# Patient Record
Sex: Female | Born: 1955
Health system: Southern US, Community
[De-identification: ages and names within clinical notes are randomized; demographics above are authoritative.]

## PROBLEM LIST (undated history)

## (undated) DIAGNOSIS — I10 Essential (primary) hypertension: Secondary | ICD-10-CM

## (undated) HISTORY — PX: CHOLECYSTECTOMY: SHX55

---

## 2017-09-07 ENCOUNTER — Other Ambulatory Visit: Payer: Self-pay

## 2017-09-07 ENCOUNTER — Encounter (HOSPITAL_BASED_OUTPATIENT_CLINIC_OR_DEPARTMENT_OTHER): Payer: Self-pay

## 2017-09-07 ENCOUNTER — Emergency Department (HOSPITAL_BASED_OUTPATIENT_CLINIC_OR_DEPARTMENT_OTHER)
Admission: EM | Admit: 2017-09-07 | Discharge: 2017-09-07 | Disposition: A | Discharge status: Home / Self Care | Payer: BLUE CROSS/BLUE SHIELD | Attending: Emergency Medicine | Admitting: Emergency Medicine

## 2017-09-07 DIAGNOSIS — Z79899 Other long term (current) drug therapy: Secondary | ICD-10-CM | POA: Insufficient documentation

## 2017-09-07 DIAGNOSIS — K2900 Acute gastritis without bleeding: Secondary | ICD-10-CM

## 2017-09-07 DIAGNOSIS — I10 Essential (primary) hypertension: Secondary | ICD-10-CM | POA: Insufficient documentation

## 2017-09-07 DIAGNOSIS — F17210 Nicotine dependence, cigarettes, uncomplicated: Secondary | ICD-10-CM | POA: Insufficient documentation

## 2017-09-07 DIAGNOSIS — R1013 Epigastric pain: Secondary | ICD-10-CM | POA: Diagnosis present

## 2017-09-07 HISTORY — DX: Essential (primary) hypertension: I10

## 2017-09-07 LAB — URINALYSIS, ROUTINE W REFLEX MICROSCOPIC
Bilirubin Urine: NEGATIVE
GLUCOSE, UA: NEGATIVE mg/dL
Hgb urine dipstick: NEGATIVE
Ketones, ur: NEGATIVE mg/dL
LEUKOCYTES UA: NEGATIVE
Nitrite: NEGATIVE
PROTEIN: NEGATIVE mg/dL
Specific Gravity, Urine: >1.03 — ABNORMAL HIGH (ref 1.005–1.030)
pH: 6 (ref 5.0–8.0)

## 2017-09-07 LAB — COMPREHENSIVE METABOLIC PANEL
ALT: 18 U/L (ref 0–44)
ANION GAP: 9 (ref 5–15)
AST: 25 U/L (ref 15–41)
Albumin: 4 g/dL (ref 3.5–5.0)
Alkaline Phosphatase: 59 U/L (ref 38–126)
BUN: 19 mg/dL (ref 8–23)
CHLORIDE: 106 mmol/L (ref 98–111)
CO2: 25 mmol/L (ref 22–32)
CREATININE: 0.71 mg/dL (ref 0.44–1.00)
Calcium: 9.7 mg/dL (ref 8.9–10.3)
Glucose, Bld: 113 mg/dL — ABNORMAL HIGH (ref 70–99)
Potassium: 3.6 mmol/L (ref 3.5–5.1)
Sodium: 140 mmol/L (ref 135–145)
Total Bilirubin: 0.6 mg/dL (ref 0.3–1.2)
Total Protein: 7.7 g/dL (ref 6.5–8.1)

## 2017-09-07 LAB — LIPASE, BLOOD: LIPASE: 37 U/L (ref 11–51)

## 2017-09-07 LAB — CBC
HCT: 40 % (ref 36.0–46.0)
Hemoglobin: 13.2 g/dL (ref 12.0–15.0)
MCH: 31.4 pg (ref 26.0–34.0)
MCHC: 33 g/dL (ref 30.0–36.0)
MCV: 95.2 fL (ref 78.0–100.0)
PLATELETS: 320 10*3/uL (ref 150–400)
RBC: 4.2 MIL/uL (ref 3.87–5.11)
RDW: 11.6 % (ref 11.5–15.5)
WBC: 14.8 10*3/uL — AB (ref 4.0–10.5)

## 2017-09-07 MED ORDER — ONDANSETRON HCL 4 MG/2ML IJ SOLN
INTRAMUSCULAR | Status: AC
  Filled 2017-09-07: qty 2

## 2017-09-07 MED ORDER — PANTOPRAZOLE SODIUM 20 MG PO TBEC: 20.0000 mg | DELAYED_RELEASE_TABLET | Freq: Every day | ORAL | 0 refills | Status: AC

## 2017-09-07 MED ORDER — SUCRALFATE 1 G PO TABS: 1.0000 g | ORAL_TABLET | Freq: Three times a day (TID) | ORAL | 0 refills | Status: AC

## 2017-09-07 MED ORDER — KETOROLAC TROMETHAMINE 30 MG/ML IJ SOLN
30.0000 mg | Freq: Once | INTRAMUSCULAR | Status: AC
  Administered 2017-09-07: 30 mg via INTRAVENOUS
  Filled 2017-09-07: qty 1

## 2017-09-07 MED ORDER — ONDANSETRON HCL 4 MG/2ML IJ SOLN
4.0000 mg | Freq: Once | INTRAMUSCULAR | Status: AC | PRN
  Administered 2017-09-07: 4 mg via INTRAVENOUS

## 2017-09-07 MED ORDER — GI COCKTAIL ~~LOC~~
30.0000 mL | Freq: Once | ORAL | Status: AC
  Administered 2017-09-07: 30 mL via ORAL
  Filled 2017-09-07: qty 30

## 2017-09-07 MED ORDER — SODIUM CHLORIDE 0.9 % IV BOLUS
500.0000 mL | Freq: Once | INTRAVENOUS | Status: AC
  Administered 2017-09-07: 500 mL via INTRAVENOUS

## 2017-09-07 MED ORDER — SODIUM CHLORIDE 0.9 % IV BOLUS: 500.0000 mL | Freq: Once | INTRAVENOUS | Status: DC

## 2017-09-07 NOTE — ED Provider Notes (Signed)
[facility] EMERGENCY DEPARTMENT Provider Note   CSN: [id number] Arrival date & time: 09/07/17  2022     History   Chief Complaint Chief Complaint  Patient presents with  . Headache  . Abdominal Pain    HPI Vicki Vance is a 62 y.o. female.  Patient is a 62 year old female who presents with abdominal pain.  She states over the last 2 days she has had some pressure in her sinuses with pain in her face around her sinuses.  She went to urgent care and got a shot of Decadron and is been using nasal spray.  She has a mild ongoing headache which she describes as pain over her sinuses.  She points to her maxillary sinuses.  She denies any fevers.  She had a little bit of nasal drainage.  No neck pain.  Today she started having some pain in her upper abdomen.  She had some nausea but no vomiting.  No diarrhea.  She is status post cholecystectomy in the past.  She has no change in bowel habits.  She denies any known history of reflux.     Past Medical History:  Diagnosis Date  . Hypertension     There are no active problems to display for this patient.   Past Surgical History:  Procedure Laterality Date  . CHOLECYSTECTOMY       OB History   None      Home Medications    Prior to Admission medications   Medication Sig Start Date End Date Taking? Authorizing Provider  pantoprazole (PROTONIX) 20 MG tablet Take 1 tablet (20 mg total) by mouth daily. 09/07/17   Rolan Bucco, MD  sucralfate (CARAFATE) 1 g tablet Take 1 tablet (1 g total) by mouth 4 (four) times daily -  with meals and at bedtime. 09/07/17   Rolan Bucco, MD    Family History No family history on file.  Social History Social History   Tobacco Use  . Smoking status: Current Every Day Smoker  . Smokeless tobacco: Never Used  Substance Use Topics  . Alcohol use: Yes    Comment: occ  . Drug use: Never     Allergies   Patient has no known allergies.   Review of Systems Review of Systems    Constitutional: Negative for chills, diaphoresis, fatigue and fever.  HENT: Negative for congestion, rhinorrhea and sneezing.   Eyes: Negative.   Respiratory: Negative for cough, chest tightness and shortness of breath.   Cardiovascular: Negative for chest pain and leg swelling.  Gastrointestinal: Positive for abdominal pain and nausea. Negative for blood in stool, diarrhea and vomiting.  Genitourinary: Negative for difficulty urinating, flank pain, frequency and hematuria.  Musculoskeletal: Negative for arthralgias and back pain.  Skin: Negative for rash.  Neurological: Positive for headaches. Negative for dizziness, speech difficulty, weakness and numbness.     Physical Exam Updated Vital Signs BP (!) 159/95 (BP Location: Left Arm)   Pulse 98   Temp 98.4 F (36.9 C) (Oral)   Resp 20   Ht 5\' 1"  (1.549 m)   Wt 74.8 kg   SpO2 96%   BMI 31.18 kg/m   Physical Exam  Constitutional: She is oriented to person, place, and time. She appears well-developed and well-nourished.  HENT:  Head: Normocephalic and atraumatic.  No specific pain over her sinuses.  No meningismus.  No photophobia  Eyes: Pupils are equal, round, and reactive to light.  Neck: Normal range of motion. Neck supple.  Cardiovascular:  Normal rate, regular rhythm and normal heart sounds.  Pulmonary/Chest: Effort normal and breath sounds normal. No respiratory distress. She has no wheezes. She has no rales. She exhibits no tenderness.  Abdominal: Soft. Bowel sounds are normal. There is tenderness in the epigastric area. There is no rebound and no guarding.  Musculoskeletal: Normal range of motion. She exhibits no edema.  Lymphadenopathy:    She has no cervical adenopathy.  Neurological: She is alert and oriented to person, place, and time.  Skin: Skin is warm and dry. No rash noted.  Psychiatric: She has a normal mood and affect.     ED Treatments / Results  Labs (all labs ordered are listed, but only abnormal  results are displayed) Labs Reviewed  COMPREHENSIVE METABOLIC PANEL - Abnormal; Notable for the following components:      Result Value   Glucose, Bld 113 (*)    All other components within normal limits  CBC - Abnormal; Notable for the following components:   WBC 14.8 (*)    All other components within normal limits  URINALYSIS, ROUTINE W REFLEX MICROSCOPIC - Abnormal; Notable for the following components:   Specific Gravity, Urine >1.030 (*)    All other components within normal limits  LIPASE, BLOOD    EKG None  Radiology No results found.  Procedures Procedures (including critical care time)  Medications Ordered in ED Medications  ondansetron (ZOFRAN) injection 4 mg (4 mg Intravenous Given 09/07/17 2110)  ketorolac (TORADOL) 30 MG/ML injection 30 mg (30 mg Intravenous Given 09/07/17 2150)  sodium chloride 0.9 % bolus 500 mL (0 mLs Intravenous Stopped 09/07/17 2222)  gi cocktail (Maalox,Lidocaine,Donnatal) (30 mLs Oral Given 09/07/17 2154)     Initial Impression / Assessment and Plan / ED Course  I have reviewed the triage vital signs and the nursing notes.  Pertinent labs & imaging results that were available during my care of the patient were reviewed by me and considered in my medical decision making (see chart for details).     Patient is a 62 year old female who presents with abdominal pain.  She has some mild tenderness in her epigastrium.  She was given Pepcid as well as a GI cocktail.  She feels much better after this.  She has no vomiting.  No ongoing abdominal pain.  Her labs are non-concerning.  There is no suggestions of pancreatitis.  Her LFTs are normal.  Her white count is mildly elevated but she has no other suggestions of infection.  Her headache seems to be sinus related and is fairly mild at this point.  She does not have any other associated symptoms.  I encouraged her to continue using the Flonase.  She has a gastroenterologist in [facility]  I gave her a  prescription for Protonix and Carafate.  I encouraged her to follow-up with the gastroenterologist if her symptoms are not improving or return here as needed for any worsening symptoms.  Final Clinical Impressions(s) / ED Diagnoses   Final diagnoses:  Acute gastritis without hemorrhage, unspecified gastritis type    ED Discharge Orders         Ordered    pantoprazole (PROTONIX) 20 MG tablet  Daily     09/07/17 2229    sucralfate (CARAFATE) 1 g tablet  3 times daily with meals & bedtime     09/07/17 2229           Rolan Bucco, MD 09/07/17 2234

## 2017-09-07 NOTE — ED Triage Notes (Signed)
C/o HA x 2 days-was seen at [facility] yesterday-given nasal spray and steroid-c/o n/v, abd pain today-NAD-steady gait

## 2017-09-07 NOTE — ED Notes (Signed)
Pt c/o epigastric pain and headache with one episode of vomiting this evening. Pt has not tried anything for her symptoms

## 2019-02-17 ENCOUNTER — Ambulatory Visit: Payer: BLUE CROSS/BLUE SHIELD | Attending: Internal Medicine

## 2019-02-17 DIAGNOSIS — Z23 Encounter for immunization: Secondary | ICD-10-CM | POA: Insufficient documentation

## 2019-02-17 NOTE — Progress Notes (Signed)
   Covid-19 Vaccination Clinic  Name:  Vicki Vance    MRN: [id number] DOB: [birth year]  02/17/2019  [name] was observed post Covid-19 immunization for 15 minutes without incidence. She was provided with Vaccine Information Sheet and instruction to access the V-Safe system.   [name] was instructed to call 911 with any severe reactions post vaccine: Marland Kitchen Difficulty breathing  . Swelling of your face and throat  . A fast heartbeat  . A bad rash all over your body  . Dizziness and weakness    Immunizations Administered    Name Date Dose VIS Date Route   Pfizer COVID-19 Vaccine 02/17/2019 11:08 AM 0.3 mL 12/15/2018 Intramuscular   Manufacturer: [employer], [employer]   Lot: [id number]   NDC: [address]-1

## 2019-03-12 ENCOUNTER — Ambulatory Visit: Payer: BLUE CROSS/BLUE SHIELD | Attending: Internal Medicine

## 2019-03-12 DIAGNOSIS — Z23 Encounter for immunization: Secondary | ICD-10-CM | POA: Insufficient documentation

## 2019-03-12 NOTE — Progress Notes (Signed)
   Covid-19 Vaccination Clinic  Name:  Vicki Vance    MRN: [id number] DOB: 1955/07/14  03/12/2019  Vicki Vance was observed post Covid-19 immunization for 15 minutes without incident. She was provided with Vaccine Information Sheet and instruction to access the V-Safe system.   Vicki Vance was instructed to call 911 with any severe reactions post vaccine: Marland Kitchen Difficulty breathing  . Swelling of face and throat  . A fast heartbeat  . A bad rash all over body  . Dizziness and weakness   Immunizations Administered    Name Date Dose VIS Date Route   Pfizer COVID-19 Vaccine 03/12/2019 11:13 AM 0.3 mL 12/15/2018 Intramuscular   Manufacturer: [employer], [employer]   Lot: [id number]   NDC: [address]-6

## 2020-03-17 ENCOUNTER — Encounter (HOSPITAL_BASED_OUTPATIENT_CLINIC_OR_DEPARTMENT_OTHER): Payer: Self-pay

## 2020-03-17 ENCOUNTER — Emergency Department (HOSPITAL_BASED_OUTPATIENT_CLINIC_OR_DEPARTMENT_OTHER): Payer: BLUE CROSS/BLUE SHIELD

## 2020-03-17 ENCOUNTER — Emergency Department (HOSPITAL_BASED_OUTPATIENT_CLINIC_OR_DEPARTMENT_OTHER)
Admission: EM | Admit: 2020-03-17 | Discharge: 2020-03-17 | Discharge status: Against Medical Advice | Payer: BLUE CROSS/BLUE SHIELD | Attending: Emergency Medicine | Admitting: Emergency Medicine

## 2020-03-17 ENCOUNTER — Other Ambulatory Visit: Payer: Self-pay

## 2020-03-17 ENCOUNTER — Other Ambulatory Visit (HOSPITAL_COMMUNITY): Payer: Self-pay | Admitting: Emergency Medicine

## 2020-03-17 DIAGNOSIS — Z20822 Contact with and (suspected) exposure to covid-19: Secondary | ICD-10-CM | POA: Diagnosis not present

## 2020-03-17 DIAGNOSIS — J40 Bronchitis, not specified as acute or chronic: Secondary | ICD-10-CM | POA: Diagnosis not present

## 2020-03-17 DIAGNOSIS — R0602 Shortness of breath: Secondary | ICD-10-CM | POA: Diagnosis present

## 2020-03-17 DIAGNOSIS — I10 Essential (primary) hypertension: Secondary | ICD-10-CM | POA: Diagnosis not present

## 2020-03-17 DIAGNOSIS — F17200 Nicotine dependence, unspecified, uncomplicated: Secondary | ICD-10-CM | POA: Insufficient documentation

## 2020-03-17 DIAGNOSIS — R000 Tachycardia, unspecified: Secondary | ICD-10-CM | POA: Diagnosis not present

## 2020-03-17 LAB — URINALYSIS, ROUTINE W REFLEX MICROSCOPIC
Bilirubin Urine: NEGATIVE
Glucose, UA: NEGATIVE mg/dL
Hgb urine dipstick: NEGATIVE
Ketones, ur: NEGATIVE mg/dL
Leukocytes,Ua: NEGATIVE
Nitrite: NEGATIVE
Protein, ur: 30 mg/dL — AB
Specific Gravity, Urine: >1.03 — ABNORMAL HIGH (ref 1.005–1.030)
pH: 5.5 (ref 5.0–8.0)

## 2020-03-17 LAB — URINALYSIS, MICROSCOPIC (REFLEX)

## 2020-03-17 LAB — COMPREHENSIVE METABOLIC PANEL
ALT: 14 U/L (ref 0–44)
AST: 23 U/L (ref 15–41)
Albumin: 4.1 g/dL (ref 3.5–5.0)
Alkaline Phosphatase: 65 U/L (ref 38–126)
Anion gap: 11 (ref 5–15)
BUN: 14 mg/dL (ref 8–23)
CO2: 22 mmol/L (ref 22–32)
Calcium: 9.1 mg/dL (ref 8.9–10.3)
Chloride: 103 mmol/L (ref 98–111)
Creatinine, Ser: 0.84 mg/dL (ref 0.44–1.00)
GFR, Estimated: >60 mL/min (ref >60)
Glucose, Bld: 118 mg/dL — ABNORMAL HIGH (ref 70–99)
Potassium: 3.6 mmol/L (ref 3.5–5.1)
Sodium: 136 mmol/L (ref 135–145)
Total Bilirubin: 0.5 mg/dL (ref 0.3–1.2)
Total Protein: 8.4 g/dL — ABNORMAL HIGH (ref 6.5–8.1)

## 2020-03-17 LAB — CBC WITH DIFFERENTIAL/PLATELET
Abs Immature Granulocytes: 0.05 10*3/uL (ref 0.00–0.07)
Basophils Absolute: 0 10*3/uL (ref 0.0–0.1)
Basophils Relative: 0 %
Eosinophils Absolute: 0.1 10*3/uL (ref 0.0–0.5)
Eosinophils Relative: 1 %
HCT: 42.2 % (ref 36.0–46.0)
Hemoglobin: 14 g/dL (ref 12.0–15.0)
Immature Granulocytes: 1 %
Lymphocytes Relative: 21 %
Lymphs Abs: 1.8 10*3/uL (ref 0.7–4.0)
MCH: 31.8 pg (ref 26.0–34.0)
MCHC: 33.2 g/dL (ref 30.0–36.0)
MCV: 95.9 fL (ref 80.0–100.0)
Monocytes Absolute: 0.7 10*3/uL (ref 0.1–1.0)
Monocytes Relative: 8 %
Neutro Abs: 6.2 10*3/uL (ref 1.7–7.7)
Neutrophils Relative %: 69 %
Platelets: 265 10*3/uL (ref 150–400)
RBC: 4.4 MIL/uL (ref 3.87–5.11)
RDW: 11.8 % (ref 11.5–15.5)
WBC: 9 10*3/uL (ref 4.0–10.5)
nRBC: 0 % (ref 0.0–0.2)

## 2020-03-17 LAB — RESP PANEL BY RT-PCR (FLU A&B, COVID) ARPGX2
Influenza A by PCR: NEGATIVE
Influenza B by PCR: NEGATIVE
SARS Coronavirus 2 by RT PCR: NEGATIVE

## 2020-03-17 LAB — LACTIC ACID, PLASMA: Lactic Acid, Venous: 1.1 mmol/L (ref 0.5–1.9)

## 2020-03-17 LAB — D-DIMER, QUANTITATIVE: D-Dimer, Quant: 0.44 ug/mL-FEU (ref 0.00–0.50)

## 2020-03-17 MED ORDER — DEXAMETHASONE SODIUM PHOSPHATE 10 MG/ML IJ SOLN
10.0000 mg | Freq: Once | INTRAMUSCULAR | Status: AC
  Administered 2020-03-17: 10 mg via INTRAVENOUS
  Filled 2020-03-17: qty 1

## 2020-03-17 MED ORDER — SODIUM CHLORIDE 0.9 % IV BOLUS
1000.0000 mL | Freq: Once | INTRAVENOUS | Status: AC
  Administered 2020-03-17: 1000 mL via INTRAVENOUS

## 2020-03-17 MED ORDER — PREDNISONE 10 MG PO TABS: 40.0000 mg | ORAL_TABLET | Freq: Every day | ORAL | 0 refills | Status: AC

## 2020-03-17 MED ORDER — ACETAMINOPHEN 500 MG PO TABS
1000.0000 mg | ORAL_TABLET | Freq: Once | ORAL | Status: AC
  Administered 2020-03-17: 1000 mg via ORAL
  Filled 2020-03-17: qty 2

## 2020-03-17 MED ORDER — ALBUTEROL SULFATE HFA 108 (90 BASE) MCG/ACT IN AERS
4.0000 | INHALATION_SPRAY | Freq: Once | RESPIRATORY_TRACT | Status: AC
  Administered 2020-03-17: 4 via RESPIRATORY_TRACT
  Filled 2020-03-17: qty 6.7

## 2020-03-17 MED ORDER — ALBUTEROL SULFATE HFA 108 (90 BASE) MCG/ACT IN AERS
4.0000 | INHALATION_SPRAY | Freq: Once | RESPIRATORY_TRACT | Status: AC
  Administered 2020-03-17: 4 via RESPIRATORY_TRACT

## 2020-03-17 MED ORDER — ONDANSETRON HCL 4 MG/2ML IJ SOLN: 4.0000 mg | Freq: Once | INTRAMUSCULAR | Status: DC

## 2020-03-17 NOTE — ED Provider Notes (Signed)
[facility] EMERGENCY DEPARTMENT Provider Note   CSN: [id number] Arrival date & time: 03/17/20  1884     History Chief Complaint  Patient presents with  . Shortness of Breath    Vicki Vance is a 65 y.o. female.  HPI     65yo female with history of smoking and hypertension presents with concern for nasal congestion, cough, dyspnea.  Nasal congestion, cough for a few days No known fever, last night checked Shortness of breath started yesterday and got worse No nausea, vomiting, diarrhea, black or bloody stools No chest pain Sob with exertion No known sick contacts No leg pain or swelling No abd pain, ear pain, sore throat, loss of taste or smell  No hx of dvt/pe, long trips car or airplane No hx of asthma/copd, does smoke cigarettes Has been taking mucinex DM  Past Medical History:  Diagnosis Date  . Hypertension     There are no problems to display for this patient.   Past Surgical History:  Procedure Laterality Date  . CHOLECYSTECTOMY       OB History   No obstetric history on file.     History reviewed. No pertinent family history.  Social History   Tobacco Use  . Smoking status: Current Every Day Smoker    Packs/day: 0.00  . Smokeless tobacco: Never Used  Vaping Use  . Vaping Use: Never used  Substance Use Topics  . Alcohol use: Yes    Comment: occ  . Drug use: Never    Home Medications Prior to Admission medications   Medication Sig Start Date End Date Taking? Authorizing Provider  predniSONE (DELTASONE) 10 MG tablet Take 4 tablets (40 mg total) by mouth daily for 4 days. 03/17/20 03/21/20 Yes Alvira Monday, MD  pantoprazole (PROTONIX) 20 MG tablet Take 1 tablet (20 mg total) by mouth daily. 09/07/17   Rolan Bucco, MD  sucralfate (CARAFATE) 1 g tablet Take 1 tablet (1 g total) by mouth 4 (four) times daily -  with meals and at bedtime. 09/07/17   Rolan Bucco, MD    Allergies    Patient has no known allergies.  Review  of Systems   Review of Systems  Constitutional: Negative for fever.  HENT: Positive for congestion. Negative for sore throat.   Eyes: Negative for visual disturbance.  Respiratory: Positive for cough, shortness of breath and wheezing.   Cardiovascular: Negative for chest pain.  Gastrointestinal: Negative for abdominal pain, nausea and vomiting.  Genitourinary: Negative for difficulty urinating.  Musculoskeletal: Negative for back pain.  Skin: Negative for rash.  Neurological: Negative for syncope and headaches.    Physical Exam Updated Vital Signs BP 110/71   Pulse (!) 114   Temp 99.1 F (37.3 C) (Oral)   Resp (!) 22   Ht 5\' 1"  (1.549 m)   Wt 72.1 kg   SpO2 96%   BMI 30.04 kg/m   Physical Exam Vitals and nursing note reviewed.  Constitutional:      General: She is not in acute distress.    Appearance: She is well-developed. She is not diaphoretic.  HENT:     Head: Normocephalic and atraumatic.  Eyes:     Conjunctiva/sclera: Conjunctivae normal.  Cardiovascular:     Rate and Rhythm: Regular rhythm. Tachycardia present.     Heart sounds: Normal heart sounds. No murmur heard. No friction rub. No gallop.   Pulmonary:     Effort: Pulmonary effort is normal. No respiratory distress.     Breath  sounds: Wheezing present. No rales.  Abdominal:     General: There is no distension.     Palpations: Abdomen is soft.     Tenderness: There is no abdominal tenderness. There is no guarding.  Musculoskeletal:        General: No tenderness.     Cervical back: Normal range of motion.  Skin:    General: Skin is warm and dry.     Findings: No erythema or rash.  Neurological:     Mental Status: She is alert and oriented to person, place, and time.     ED Results / Procedures / Treatments   Labs (all labs ordered are listed, but only abnormal results are displayed) Labs Reviewed  URINALYSIS, ROUTINE W REFLEX MICROSCOPIC - Abnormal; Notable for the following components:       Result Value   Color, Urine AMBER (*)    Specific Gravity, Urine >1.030 (*)    Protein, ur 30 (*)    All other components within normal limits  COMPREHENSIVE METABOLIC PANEL - Abnormal; Notable for the following components:   Glucose, Bld 118 (*)    Total Protein 8.4 (*)    All other components within normal limits  URINALYSIS, MICROSCOPIC (REFLEX) - Abnormal; Notable for the following components:   Bacteria, UA FEW (*)    All other components within normal limits  RESP PANEL BY RT-PCR (FLU A&B, COVID) ARPGX2  CULTURE, BLOOD (ROUTINE X 2)  CULTURE, BLOOD (ROUTINE X 2)  URINE CULTURE  CBC WITH DIFFERENTIAL/PLATELET  LACTIC ACID, PLASMA  D-DIMER, QUANTITATIVE    EKG EKG Interpretation  Date/Time:  Monday March 17 2020 10:16:57 EDT Ventricular Rate:  122 PR Interval:    QRS Duration: 83 QT Interval:  338 QTC Calculation: 482 R Axis:   67 Text Interpretation: Sinus tachycardia Borderline repol abnormality, diffuse leads No previous ECGs available Confirmed by Alvira Monday ([id number]) on 03/17/2020 10:26:18 AM   Radiology DG Chest Portable 1 View  Result Date: 03/17/2020 CLINICAL DATA:  Shortness of breath. EXAM: PORTABLE CHEST 1 VIEW COMPARISON:  None. FINDINGS: The heart size and mediastinal contours are within normal limits. Both lungs are clear. No pneumothorax or pleural effusion is noted. The visualized skeletal structures are unremarkable. IMPRESSION: No active disease. Electronically Signed   By: Lupita Raider M.D.   On: 03/17/2020 11:38    Procedures Procedures   Medications Ordered in ED Medications  sodium chloride 0.9 % bolus 1,000 mL ( Intravenous Stopped 03/17/20 1210)  dexamethasone (DECADRON) injection 10 mg (10 mg Intravenous Given 03/17/20 1058)  albuterol (VENTOLIN HFA) 108 (90 Base) MCG/ACT inhaler 4 puff (4 puffs Inhalation Given 03/17/20 1049)  acetaminophen (TYLENOL) tablet 1,000 mg (1,000 mg Oral Given 03/17/20 1119)  sodium chloride 0.9 % bolus 1,000  mL ( Intravenous Stopped 03/17/20 1328)  albuterol (VENTOLIN HFA) 108 (90 Base) MCG/ACT inhaler 4 puff (4 puffs Inhalation Given 03/17/20 1312)    ED Course  I have reviewed the triage vital signs and the nursing notes.  Pertinent labs & imaging results that were available during my care of the patient were reviewed by me and considered in my medical decision making (see chart for details).    MDM Rules/Calculators/A&P                          65yo female with history of smoking and hypertension presents with concern for nasal congestion, cough, dyspnea.  Differential diagnosis for dyspnea includes ACS,  PE, COPD exacerbation, CHF exacerbation, anemia, pneumonia, viral etiology such as COVID 19 infection, metabolic abnormality.  Chest x-ray was done which showed no acute abnormalities. EKG was evaluated by me which showed sinus tachycardia.  No clinical signs of CHF.  DDimer negative.  Denies CP, doubt ACS.  COVID 19 testing negative.  History and exam appear most consistent with URI and bronchitis (suspect undiagnosed COPD in setting of smoking), however she has persistent tachycardia and saturations borderlin 91%.  Unclear if tachycardia may be secondary to albuterol, taking mucinex DM however given borderline O2 saturations and persistent tachycardia consider higher risk for PE and further evaluation.  Discussed that would like to evaluate further given HR persistently elevated to 120s however she would like to leave AMA. She understands risks of death and disability and has the capacity to make this decision.  Given rx for steroids.   Final Clinical Impression(s) / ED Diagnoses Final diagnoses:  Bronchitis  Shortness of breath  Tachycardia    Rx / DC Orders ED Discharge Orders         Ordered    predniSONE (DELTASONE) 10 MG tablet  Daily        03/17/20 1404           Alvira Monday, MD 03/18/20 [phone]

## 2020-03-17 NOTE — ED Notes (Signed)
ED Provider at bedside. 

## 2020-03-17 NOTE — ED Notes (Signed)
ED Provider at bedside. Will get vitals after they are done. 

## 2020-03-17 NOTE — ED Notes (Signed)
Pt refusing CT Chest

## 2020-03-17 NOTE — ED Notes (Signed)
Pt removed monitoring and IV on her own, signed out AMA, verbalizes understanding of coming back to ED if needed.

## 2020-03-17 NOTE — ED Triage Notes (Signed)
SOB/cough and nasal drainage x 5 days.  Pt has visible WOB on exertion and is tachycardic on initial exam.  Denies CP, N/V/D

## 2020-03-17 NOTE — ED Notes (Signed)
XR at bedside

## 2020-03-17 NOTE — ED Notes (Signed)
Pt states she does not need nausea medication at this time, will hold as PRN, MD Schlossman made aware and agrees.

## 2020-03-18 LAB — CULTURE, BLOOD (ROUTINE X 2)

## 2020-03-19 LAB — URINE CULTURE: Culture: NO GROWTH

## 2020-03-20 LAB — CULTURE, BLOOD (ROUTINE X 2): Culture: NO GROWTH

## 2020-03-21 LAB — CULTURE, BLOOD (ROUTINE X 2): Culture: NO GROWTH

## 2020-03-22 LAB — CULTURE, BLOOD (ROUTINE X 2)
Special Requests: ADEQUATE
Special Requests: ADEQUATE

## 2021-12-28 IMAGING — DX DG CHEST 1V PORT
1 series · 1 of 1 positions shown · non-contrast
Comparison: None.

CLINICAL DATA: Shortness of breath.

EXAM:
PORTABLE CHEST 1 VIEW

[chest ap]
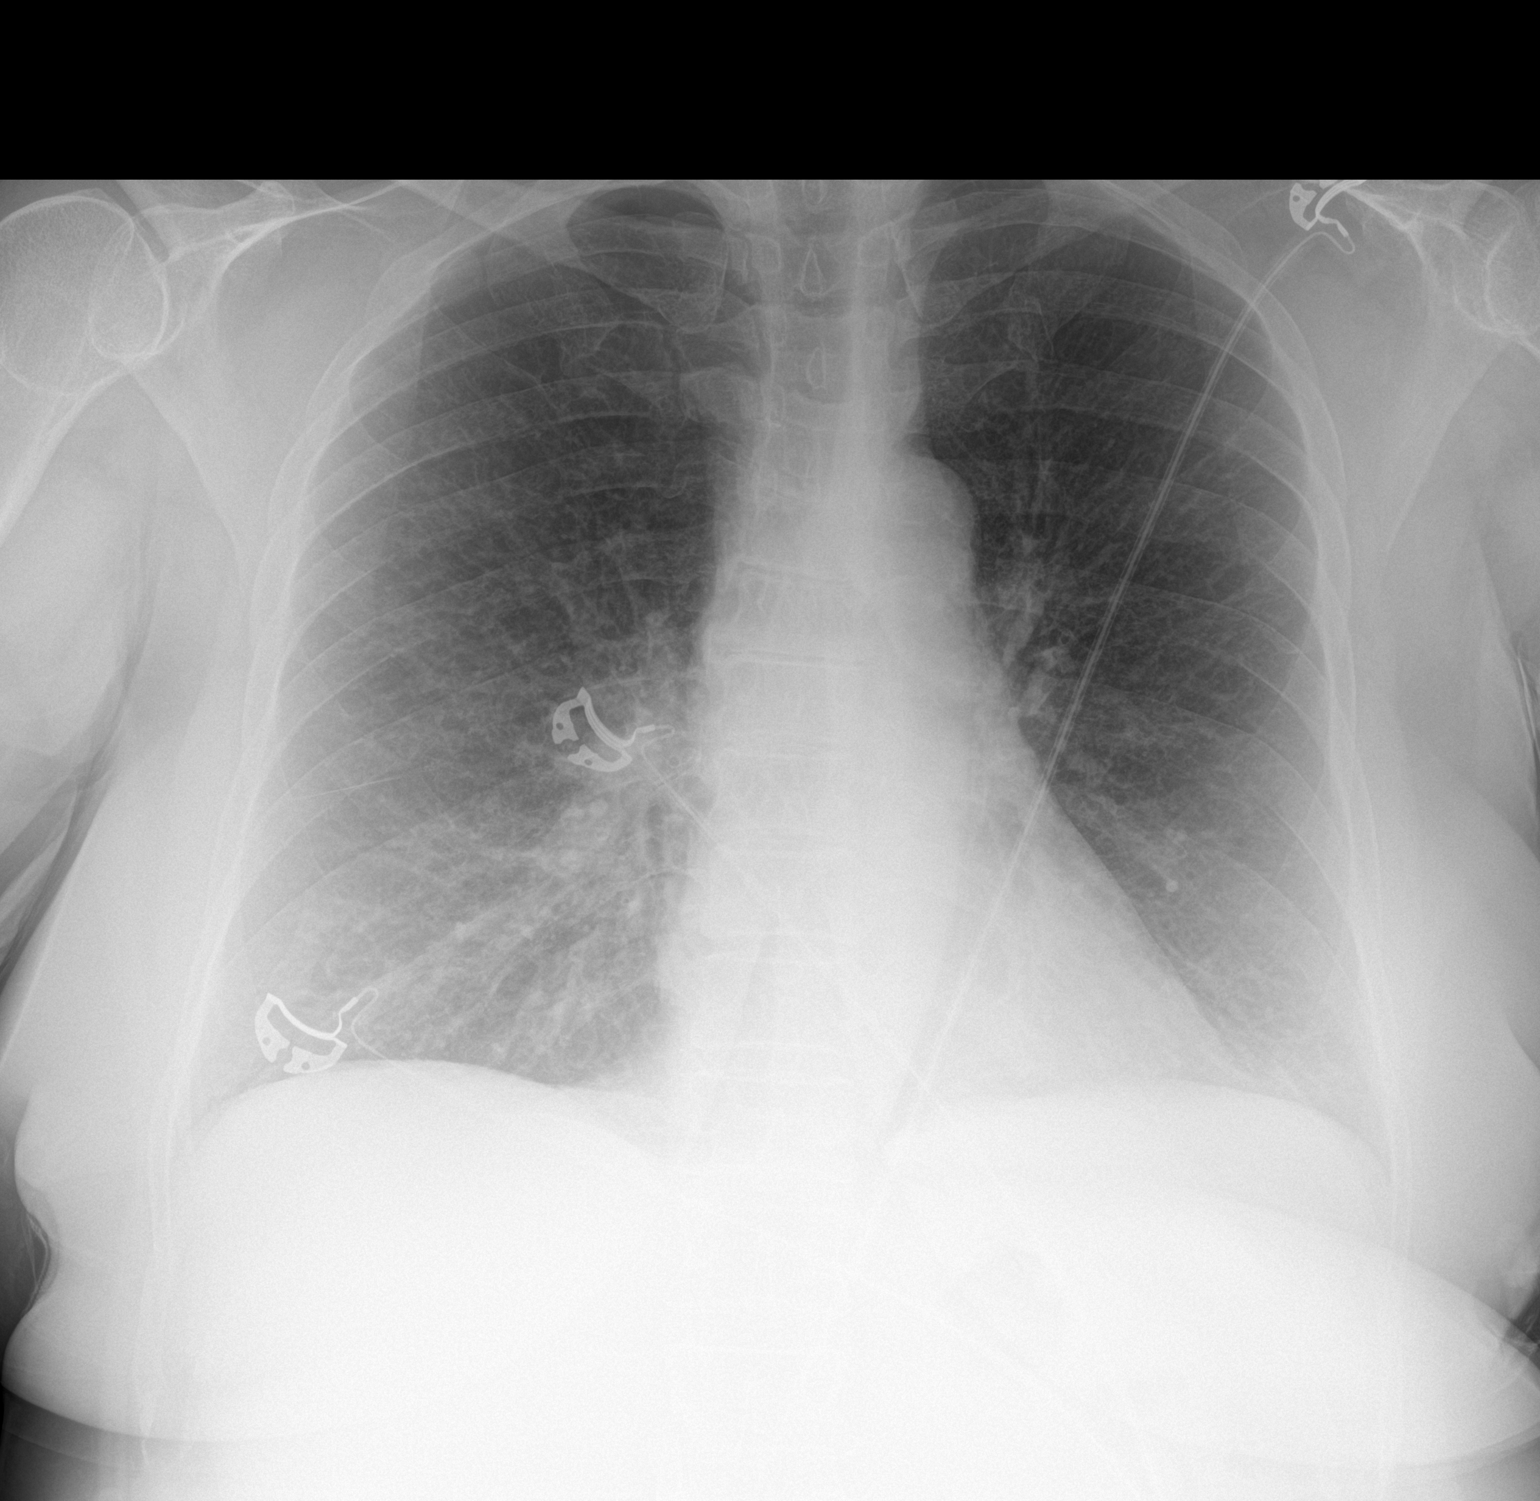

[1 of 1 positions shown; findings below may reference images not displayed]

FINDINGS: The heart size and mediastinal contours are within normal limits.
Both lungs are clear. No pneumothorax or pleural effusion is noted.
The visualized skeletal structures are unremarkable.
IMPRESSION: No active disease.
